# Patient Record
Sex: Female | Born: 2005 | Race: White | Hispanic: Yes | Marital: Single | Smoking: Never smoker
Health system: Southern US, Community
[De-identification: ages and names within clinical notes are randomized; demographics above are authoritative.]

## PROBLEM LIST (undated history)

## (undated) ENCOUNTER — Inpatient Hospital Stay: Payer: Self-pay

## (undated) DIAGNOSIS — J45909 Unspecified asthma, uncomplicated: Secondary | ICD-10-CM

---
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
## 2024-01-07 ENCOUNTER — Encounter: Payer: Self-pay | Admitting: *Deleted

## 2024-01-07 ENCOUNTER — Other Ambulatory Visit: Payer: Self-pay

## 2024-01-07 DIAGNOSIS — R1011 Right upper quadrant pain: Secondary | ICD-10-CM | POA: Insufficient documentation

## 2024-01-07 DIAGNOSIS — J45909 Unspecified asthma, uncomplicated: Secondary | ICD-10-CM | POA: Diagnosis not present

## 2024-01-07 DIAGNOSIS — R1013 Epigastric pain: Secondary | ICD-10-CM | POA: Diagnosis present

## 2024-01-07 LAB — CBC
HCT: 40.9 % (ref 36.0–46.0)
Hemoglobin: 13.2 g/dL (ref 12.0–15.0)
MCH: 29.7 pg (ref 26.0–34.0)
MCHC: 32.3 g/dL (ref 30.0–36.0)
MCV: 91.9 fL (ref 80.0–100.0)
Platelets: 398 10*3/uL (ref 150–400)
RBC: 4.45 MIL/uL (ref 3.87–5.11)
RDW: 13.2 % (ref 11.5–15.5)
WBC: 10.2 10*3/uL (ref 4.0–10.5)
nRBC: 0 % (ref 0.0–0.2)

## 2024-01-07 LAB — URINALYSIS, ROUTINE W REFLEX MICROSCOPIC
Bacteria, UA: NONE SEEN
Bilirubin Urine: NEGATIVE
Glucose, UA: NEGATIVE mg/dL
Ketones, ur: NEGATIVE mg/dL
Leukocytes,Ua: NEGATIVE
Nitrite: NEGATIVE
Protein, ur: 30 mg/dL — AB
Specific Gravity, Urine: 1.026 (ref 1.005–1.030)
pH: 5 (ref 5.0–8.0)

## 2024-01-07 LAB — COMPREHENSIVE METABOLIC PANEL WITH GFR
ALT: 21 U/L (ref 0–44)
AST: 29 U/L (ref 15–41)
Albumin: 3.9 g/dL (ref 3.5–5.0)
Alkaline Phosphatase: 90 U/L (ref 38–126)
Anion gap: 8 (ref 5–15)
BUN: 8 mg/dL (ref 6–20)
CO2: 25 mmol/L (ref 22–32)
Calcium: 9.1 mg/dL (ref 8.9–10.3)
Chloride: 103 mmol/L (ref 98–111)
Creatinine, Ser: 0.7 mg/dL (ref 0.44–1.00)
GFR, Estimated: >60 mL/min (ref >60)
Glucose, Bld: 87 mg/dL (ref 70–99)
Potassium: 3.7 mmol/L (ref 3.5–5.1)
Sodium: 136 mmol/L (ref 135–145)
Total Bilirubin: 0.4 mg/dL (ref 0.0–1.2)
Total Protein: 8 g/dL (ref 6.5–8.1)

## 2024-01-07 LAB — POC URINE PREG, ED: Preg Test, Ur: NEGATIVE

## 2024-01-07 LAB — LIPASE, BLOOD: Lipase: 31 U/L (ref 11–51)

## 2024-01-07 NOTE — ED Triage Notes (Signed)
 Pt ambulatory to triage. Pt has abd pain and lower back pain.  Sx began today.  Pt also has nausea.  No vag bleeding.  Pt reports urinary frequency.  Pt alert  speech clear.

## 2024-01-08 ENCOUNTER — Emergency Department: Payer: MEDICAID

## 2024-01-08 ENCOUNTER — Emergency Department
Admission: EM | Admit: 2024-01-08 | Discharge: 2024-01-08 | Disposition: A | Discharge status: Home / Self Care | Payer: MEDICAID | Attending: Emergency Medicine | Admitting: Emergency Medicine

## 2024-01-08 DIAGNOSIS — R1013 Epigastric pain: Secondary | ICD-10-CM

## 2024-01-08 MED ORDER — METOCLOPRAMIDE HCL 10 MG PO TABS: 10.0000 mg | ORAL_TABLET | Freq: Four times a day (QID) | ORAL | 0 refills | Status: DC | PRN

## 2024-01-08 MED ORDER — IOHEXOL 300 MG/ML  SOLN
100.0000 mL | Freq: Once | INTRAMUSCULAR | Status: AC | PRN
  Administered 2024-01-08: 100 mL via INTRAVENOUS

## 2024-01-08 MED ORDER — FAMOTIDINE 20 MG PO TABS
20.0000 mg | ORAL_TABLET | Freq: Once | ORAL | Status: AC
  Administered 2024-01-08: 20 mg via ORAL
  Filled 2024-01-08: qty 1

## 2024-01-08 MED ORDER — ONDANSETRON 4 MG PO TBDP
4.0000 mg | ORAL_TABLET | Freq: Once | ORAL | Status: AC
  Administered 2024-01-08: 4 mg via ORAL
  Filled 2024-01-08: qty 1

## 2024-01-08 MED ORDER — ALUM & MAG HYDROXIDE-SIMETH 200-200-20 MG/5ML PO SUSP
15.0000 mL | Freq: Once | ORAL | Status: AC
  Administered 2024-01-08: 15 mL via ORAL
  Filled 2024-01-08: qty 30

## 2024-01-08 MED ORDER — SUCRALFATE 1 G PO TABS: 1.0000 g | ORAL_TABLET | Freq: Four times a day (QID) | ORAL | 0 refills | Status: DC

## 2024-01-08 MED ORDER — KETOROLAC TROMETHAMINE 30 MG/ML IJ SOLN
15.0000 mg | Freq: Once | INTRAMUSCULAR | Status: AC
  Administered 2024-01-08: 15 mg via INTRAVENOUS
  Filled 2024-01-08: qty 1

## 2024-01-08 MED ORDER — FAMOTIDINE 20 MG PO TABS: 20.0000 mg | ORAL_TABLET | Freq: Two times a day (BID) | ORAL | 0 refills | Status: DC

## 2024-01-08 NOTE — ED Provider Notes (Addendum)
 [facility] Provider Note    Event Date/Time   First MD Initiated Contact with Patient 01/08/24 0145     (approximate)   History   Abdominal Pain and Back Pain   HPI  Michelle Mclaughlin is a 18 y.o. female G2, P0.  Denies pregnancy today.  Prior history of asthma   Patient reports previous gallbladder removal about 7 or so years ago as a child  For the last month has intermittently had episodes of a pain in her upper mid abdomen and occasionally she will get a little bit of a shooting discomfort towards her right flank.  No lower or pelvic pain except she reports that she had slight lower abdominal cramping a few days ago at the end of her normal menstrual cycle but reports that is normal for her.  No fevers no chills no nausea or vomiting.  No pain or burning with urination.  No active vaginal bleeding, discharge or pelvic pain.  Right now reports a very mild discomfort in her upper abdomen she points up on her shirt and points towards her epigastrium  There is no associated chest pain or difficulty breathing  Physical Exam   Triage Vital Signs: ED Triage Vitals  Encounter Vitals Group     BP 01/07/24 2249 112/64     Systolic BP Percentile --      Diastolic BP Percentile --      Pulse Rate 01/07/24 2249 78     Resp 01/07/24 2249 18     Temp 01/07/24 2249 98.5 F (36.9 C)     Temp Source 01/07/24 2249 Oral     SpO2 01/07/24 2249 97 %     Weight 01/07/24 2247 148 lb (67.1 kg)     Height 01/07/24 2247 5\' 1"  (1.549 m)     Head Circumference --      Peak Flow --      Pain Score 01/07/24 2246 7     Pain Loc --      Pain Education --      Exclude from Growth Chart --     Most recent vital signs: Vitals:   01/08/24 0330 01/08/24 0515  BP: (!) 115/59 97/65  Pulse: 71 69  Resp: 18 18  Temp:  98.5 F (36.9 C)  SpO2: 100% 100%     General: Awake, no distress.  She is very pleasant accompanied by a friend. CV:  Good peripheral  perfusion.  Normal tone to the right Resp:  Normal effort.  Abd:  No distention.  Soft nontender nondistended throughout except she reports slight discomfort to palpation in the epigastrium without rebound or guarding.  She also reports a very mild discomfort to deep palpation in the right upper quadrant.  No rebound or guarding.  No pain across the lower abdomen suprapubic region.  No pain McBurney's point Other:  awake alert very well-appearing nontoxic   ED Results / Procedures / Treatments   Labs (all labs ordered are listed, but only abnormal results are displayed) Labs Reviewed  URINALYSIS, ROUTINE W REFLEX MICROSCOPIC - Abnormal; Notable for the following components:      Result Value   Color, Urine YELLOW (*)    APPearance HAZY (*)    Hgb urine dipstick LARGE (*)    Protein, ur 30 (*)    All other components within normal limits  LIPASE, BLOOD  COMPREHENSIVE METABOLIC PANEL WITH GFR  CBC  POC URINE PREG, ED   Labs interpreted as  normal CBC and comprehensive metabolic panel lipase.  Normal LFTs.  No evidence to suggest acute hepatobiliary dysfunction.  Normal white count afebrile noted with a very reassuring exam highly arguing against an acute intra-abdominal catastrophe or major infectious process.  She has no symptoms of suggest obstruction.  Urinalysis without evidence of acute infection, hemoglobin noted  EKG     RADIOLOGY  CT abdomen pelvis interpreted by me as grossly negative for acute finding.  Transvaginal ultrasound pending at time of signout, Dr. Vicenta Graft to follow-up    PROCEDURES:  Critical Care performed: No  Procedures   MEDICATIONS ORDERED IN ED: Medications  famotidine  (PEPCID ) tablet 20 mg (20 mg Oral Given 01/08/24 0212)  alum & mag hydroxide-simeth (MAALOX/MYLANTA) 200-200-20 MG/5ML suspension 15 mL (15 mLs Oral Given 01/08/24 1610)  ondansetron  (ZOFRAN -ODT) disintegrating tablet 4 mg (4 mg Oral Given 01/08/24 0212)  ketorolac  (TORADOL ) 30  MG/ML injection 15 mg (15 mg Intravenous Given 01/08/24 0514)  iohexol  (OMNIPAQUE ) 300 MG/ML solution 100 mL (100 mLs Intravenous Contrast Given 01/08/24 0522)     IMPRESSION / MDM / ASSESSMENT AND PLAN / ED COURSE  I reviewed the triage vital signs and the nursing notes.                              Differential diagnosis includes, but is not limited to, possible nephrolithiasis, dyspepsia, retained common bile duct stone, hepatobiliary disease, gastric ulcer disease, gastritis, etc.  No symptoms that suggest colitis or obstruction no lower abdominal pain pelvic or vaginal symptoms.  Patient's presentation is most consistent with acute complicated illness / injury requiring diagnostic workup.  No gynecologic symptoms.  Negative pregnancy test.  No pelvic or lower abdominal pain suggest acute cystic pain ruptured cyst etc. noted by clinical examination or history at this time.    Overall her clinical examination is very reassuring.   ----------------------------------------- 5:02 AM on 01/08/2024 ----------------------------------------- Workup very reassuring, some element of steatosis on ultrasound which is somewhat unusual given the patient's age.  However she has normal LFTs and no evidence of suggest an acute hepatitis.  At this juncture she reported to nurse that she started to have sharp pain once again, and on evaluation she currently reports a sharp pain in her epigastrium.  She appears uncomfortable and is requesting pain medication but reports that she has had fentanyl like pain medicine in the past and it has caused her to have stomach cramps and pain she would like to avoid this type of medication.  I have ordered Toradol .  There is no evidence to suggest acute bleeding.  Will order CT to further evaluate evaluate for cause such as nephrolithiasis, epigastric process, internal hernia, etc. also in the context of the patient's age and sharp nature though it is epigastric will  order transvaginal ultrasound to exclude acute ovarian torsion or obvious ovarian process   Ongoing care and disposition assigned to my oncoming partner Dr. Vicenta Graft, follow-up on reassessment of patient and pending transvaginal ultrasound  FINAL CLINICAL IMPRESSION(S) / ED DIAGNOSES   Final diagnoses:  Epigastric pain     Rx / DC Orders   ED Discharge Orders     None        Note:  This document was prepared using Dragon voice recognition software and may include unintentional dictation errors.   Iver Marker, MD 01/08/24 9604    Iver Marker, MD 01/08/24 [phone]

## 2024-01-08 NOTE — Discharge Instructions (Addendum)
 Your lab tests, CT scan, and ultrasounds today were all okay.  We are unable to find a specific cause for your pain, but it is likely to be related to stomach irritation.  Please take carafate, reglan, and pepcid  as prescribed for the next week to see if this resolves your symptoms, and make an appointment with gastroenterology.

## 2024-01-08 NOTE — ED Provider Notes (Signed)
 Procedures     ----------------------------------------- 7:58 AM on 01/08/2024 ----------------------------------------- Patient reports feeling a bit better now.  Transvaginal ultrasound is normal.  Reassuring workup overall. Patient does now report that symptoms have correlated with her menstrual cycle last few months.  She does have a gynecologist she can follow-up and I counseled her to seek their evaluation in case these are endometriosis symptoms. Will do trial of antacids for now.     Jacquie Maudlin, MD 01/08/24 [phone]

## 2024-10-14 ENCOUNTER — Observation Stay: Payer: MEDICAID

## 2024-10-14 ENCOUNTER — Observation Stay
Admission: EM | Admit: 2024-10-14 | Discharge: 2024-10-14 | Disposition: A | Discharge status: Home / Self Care | Payer: MEDICAID | Source: Ambulatory Visit | Attending: Obstetrics and Gynecology | Admitting: Obstetrics and Gynecology

## 2024-10-14 ENCOUNTER — Other Ambulatory Visit: Payer: Self-pay

## 2024-10-14 DIAGNOSIS — O368190 Decreased fetal movements, unspecified trimester, not applicable or unspecified: Principal | ICD-10-CM | POA: Diagnosis present

## 2024-10-14 DIAGNOSIS — O368130 Decreased fetal movements, third trimester, not applicable or unspecified: Secondary | ICD-10-CM | POA: Diagnosis present

## 2024-10-14 DIAGNOSIS — Z3A34 34 weeks gestation of pregnancy: Secondary | ICD-10-CM | POA: Insufficient documentation

## 2024-10-14 LAB — URINALYSIS, ROUTINE W REFLEX MICROSCOPIC
Bilirubin Urine: NEGATIVE
Glucose, UA: NEGATIVE mg/dL
Ketones, ur: NEGATIVE mg/dL
Nitrite: NEGATIVE
Protein, ur: NEGATIVE mg/dL
Specific Gravity, Urine: 1.02 (ref 1.005–1.030)
pH: 6 (ref 5.0–8.0)

## 2024-10-14 NOTE — OB Triage Note (Signed)
 Michelle Mclaughlin 19 y.o. @G3P0  @34wGA   presents to Labor & Delivery triage via wheelchair steered by ED staff reporting decreased fetal movement. Patient states that she was seen in [facility] today for [facility] appointment and was told by the provider to come to the hospital for evaluation for the decreased fetal movement. She denies signs and symptoms consistent with rupture of membranes or active vaginal bleeding. SABRA External FM and TOCO applied to non-tender abdomen. Initial FHR 135. Vital signs obtained and within normal limits. Patient oriented to care environment including call bell and bed control use. Wyvonna Coe, CNM notified of patient's arrival.

## 2024-10-14 NOTE — Discharge Summary (Signed)
 Patient ID: Michelle Mclaughlin MRN: [id number] DOB/AGE: November 04, 2005 19 y.o.  Admit date: 10/14/2024 Discharge date: 10/14/2024  Admission Diagnoses: 19 y.o. G3P0020 at [redacted]w[redacted]d present with decreased fetal movement.  Discharge Diagnoses:  Labor: not present.  Fetal Wellbeing: NST is Reassuring Cat 1 tracing. D/c home stable, precautions reviewed, follow-up as scheduled.   Factors complicating pregnancy: H/o recurrent SAB Generalized anxiety disorder  Prenatal Procedures: NST and ultrasound  Consults: None  Significant Diagnostic Studies:  Results for orders placed or performed during the hospital encounter of 10/14/24 (from the past week)  Urinalysis, Routine w reflex microscopic -Urine, Clean Catch   Collection Time: 10/14/24  3:14 PM  Result Value Ref Range   Color, Urine YELLOW (A) YELLOW   APPearance CLEAR (A) CLEAR   Specific Gravity, Urine 1.020 1.005 - 1.030   pH 6.0 5.0 - 8.0   Glucose, UA NEGATIVE NEGATIVE mg/dL   Hgb urine dipstick SMALL (A) NEGATIVE   Bilirubin Urine NEGATIVE NEGATIVE   Ketones, ur NEGATIVE NEGATIVE mg/dL   Protein, ur NEGATIVE NEGATIVE mg/dL   Nitrite NEGATIVE NEGATIVE   Leukocytes,Ua TRACE (A) NEGATIVE   RBC / HPF 0-5 0 - 5 RBC/hpf   WBC, UA 0-5 0 - 5 WBC/hpf   Bacteria, UA FEW (A) NONE SEEN   Squamous Epithelial / HPF 0-5 0 - 5 /HPF   Mucus PRESENT     Treatments: PO hydration  Hospital Course:  This is a 19 y.o. G3P0020 with IUP at [redacted]w[redacted]d seen for decreased fetal movement.  She had a reactive NST and an 8/8 BPP.  She was observed, fetal heart rate monitoring remained reassuring, and she had no signs/symptoms of preterm labor or other maternal-fetal concerns.  She was deemed stable for discharge to home with outpatient follow up.  Discharge Physical Exam:  BP (!) 104/46 (BP Location: Right Arm)   Pulse 90   Temp 98.3 F (36.8 C) (Oral)   Resp 16   LMP 02/15/2024   General: NAD CV: RRR Pulm: nl effort ABD: s/nd/nt, gravid DVT  Evaluation: LE non-ttp, no evidence of DVT on exam.  NST: FHR baseline: 140 bpm Variability: moderate Accelerations: yes Decelerations: none Category/reactivity: reactive   TOCO: quiet SVE: deferred      Discharge Condition: Stable  Disposition: Discharge disposition: 01-Home or Self Care        Allergies as of 10/14/2024   No Known Allergies      Medication List     STOP taking these medications    albuterol 108 (90 Base) MCG/ACT inhaler Commonly known as: VENTOLIN HFA   famotidine  20 MG tablet Commonly known as: PEPCID    hydrOXYzine  25 MG capsule Commonly known as: Vistaril    lansoprazole  15 MG capsule Commonly known as: Prevacid    metoCLOPramide  10 MG tablet Commonly known as: REGLAN    ondansetron  4 MG tablet Commonly known as: ZOFRAN    sucralfate  1 g tablet Commonly known as: Carafate        TAKE these medications    prenatal multivitamin Tabs tablet Take 1 tablet by mouth daily at 12 noon.        Follow-up Information     [facility] Follow up.   Specialty: General Practice Why: Keep all scheduled appointments, Call with any questions or concerns Contact information: [address] [place] [address] [phone]                 Signed:  DELON MYRON HOWARD 10/14/2024 6:15 PM

## 2024-10-14 NOTE — OB Triage Note (Signed)
 Pt reports decreased fetal movement x 2 days with decrease in fetal kick strength. Michelle Mclaughlin
